# Patient Record
Sex: Female | Born: 1960 | Hispanic: Yes | Marital: Single | Smoking: Never smoker
Health system: Southern US, Community
[De-identification: ages and names within clinical notes are randomized; demographics above are authoritative.]

## PROBLEM LIST (undated history)

## (undated) DIAGNOSIS — E119 Type 2 diabetes mellitus without complications: Secondary | ICD-10-CM

## (undated) DIAGNOSIS — E7800 Pure hypercholesterolemia, unspecified: Secondary | ICD-10-CM

## (undated) DIAGNOSIS — I10 Essential (primary) hypertension: Secondary | ICD-10-CM

---

## 2018-07-09 ENCOUNTER — Emergency Department (HOSPITAL_COMMUNITY): Payer: Medicaid - Out of State

## 2018-07-09 ENCOUNTER — Other Ambulatory Visit: Payer: Self-pay

## 2018-07-09 ENCOUNTER — Encounter (HOSPITAL_COMMUNITY): Payer: Self-pay | Admitting: Emergency Medicine

## 2018-07-09 ENCOUNTER — Emergency Department (HOSPITAL_COMMUNITY)
Admission: EM | Admit: 2018-07-09 | Discharge: 2018-07-09 | Disposition: A | Discharge status: Home / Self Care | Payer: Medicaid - Out of State | Attending: Emergency Medicine | Admitting: Emergency Medicine

## 2018-07-09 DIAGNOSIS — I10 Essential (primary) hypertension: Secondary | ICD-10-CM | POA: Insufficient documentation

## 2018-07-09 DIAGNOSIS — Y999 Unspecified external cause status: Secondary | ICD-10-CM | POA: Diagnosis not present

## 2018-07-09 DIAGNOSIS — R1011 Right upper quadrant pain: Secondary | ICD-10-CM | POA: Diagnosis present

## 2018-07-09 DIAGNOSIS — Y93E2 Activity, laundry: Secondary | ICD-10-CM | POA: Insufficient documentation

## 2018-07-09 DIAGNOSIS — Y92013 Bedroom of single-family (private) house as the place of occurrence of the external cause: Secondary | ICD-10-CM | POA: Diagnosis not present

## 2018-07-09 DIAGNOSIS — S0003XA Contusion of scalp, initial encounter: Secondary | ICD-10-CM | POA: Diagnosis not present

## 2018-07-09 DIAGNOSIS — W01198A Fall on same level from slipping, tripping and stumbling with subsequent striking against other object, initial encounter: Secondary | ICD-10-CM | POA: Diagnosis not present

## 2018-07-09 DIAGNOSIS — S301XXA Contusion of abdominal wall, initial encounter: Secondary | ICD-10-CM | POA: Insufficient documentation

## 2018-07-09 DIAGNOSIS — E119 Type 2 diabetes mellitus without complications: Secondary | ICD-10-CM | POA: Diagnosis not present

## 2018-07-09 HISTORY — DX: Pure hypercholesterolemia, unspecified: E78.00

## 2018-07-09 HISTORY — DX: Type 2 diabetes mellitus without complications: E11.9

## 2018-07-09 HISTORY — DX: Essential (primary) hypertension: I10

## 2018-07-09 MED ORDER — MELOXICAM 7.5 MG PO TABS: 7.5000 mg | ORAL_TABLET | Freq: Two times a day (BID) | ORAL | 0 refills | Status: AC | PRN

## 2018-07-09 MED ORDER — MELOXICAM 7.5 MG PO TABS: 7.5000 mg | ORAL_TABLET | Freq: Two times a day (BID) | ORAL | 0 refills | Status: DC | PRN

## 2018-07-09 NOTE — ED Provider Notes (Signed)
[facility] EMERGENCY DEPARTMENT Provider Note   CSN: [id number] Arrival date & time: 07/09/18  2123   History   Chief Complaint Chief Complaint  Patient presents with  . Fall    HPI Audrey Morales is a 57 y.o. female.  HPI  The patient is a very pleasant 57 year old female, she has a known diabetic with high blood pressure.  She presents to the hospital today after having a fall while she was walking in her bedroom, she lost her balance while she was trying to carry laundry falling around the edge of the bed and striking her right flank and side.  This occurred yesterday, the pain has been persistent, mild, worse with rotation, associated with a small injury to the head when she bumped her head and has a small hematoma to the crown of the head.  There was no loss of consciousness, no nausea or vomiting, no hematuria, no abdominal pain.  She is concerned that she may have injured her kidney or her ribs.  Specifically the patient does not have any significant pain with deep breathing.  She has had no medications other than some over-the-counter medications prior to arrival.  Past Medical History:  Diagnosis Date  . Diabetes mellitus without complication (HCC)   . Hypercholesteremia   . Hypertension     There are no active problems to display for this patient.   History reviewed. No pertinent surgical history.   OB History   None      Home Medications    Prior to Admission medications   Medication Sig Start Date End Date Taking? Authorizing Provider  meloxicam (MOBIC) 7.5 MG tablet Take 1 tablet (7.5 mg total) by mouth 2 (two) times daily as needed for up to 7 days for pain. 07/09/18 07/16/18  Eber Hong, MD    Family History No family history on file.  Social History Social History   Tobacco Use  . Smoking status: Never Smoker  . Smokeless tobacco: Never Used  Substance Use Topics  . Alcohol use: Never    Frequency: Never  . Drug use: Never     Allergies     Patient has no allergy information on record.   Review of Systems Review of Systems  All other systems reviewed and are negative.    Physical Exam Updated Vital Signs BP (!) 159/87 (BP Location: Left Arm)   Pulse 84   Temp 97.9 F (36.6 C) (Oral)   Resp 16   Ht 1.702 m (5\' 7" )   Wt 105.7 kg   LMP 07/09/2018 (LMP Unknown)   SpO2 100%   BMI 36.49 kg/m   Physical Exam  Constitutional: She appears well-developed and well-nourished. No distress.  HENT:  Head: Normocephalic.  Mouth/Throat: Oropharynx is clear and moist. No oropharyngeal exudate.  Small hematoma to the posterior occiput at the crown, < 2 cm and very small, no open breaks to the skin, no depression of the skull - no hemotympanum and no racoon eyes or battles sign.  Eyes: Pupils are equal, round, and reactive to light. Conjunctivae and EOM are normal. Right eye exhibits no discharge. Left eye exhibits no discharge. No scleral icterus.  Neck: Normal range of motion. Neck supple. No JVD present. No thyromegaly present.  Cardiovascular: Normal rate, regular rhythm, normal heart sounds and intact distal pulses. Exam reveals no gallop and no friction rub.  No murmur heard. Pulmonary/Chest: Effort normal and breath sounds normal. No respiratory distress. She has no wheezes. She has no rales.  She exhibits tenderness ( over the R lower lateral ribs).  Abdominal: Soft. Bowel sounds are normal. She exhibits no distension and no mass. There is tenderness ( ttp over the R flank and side - bruising present).  Musculoskeletal: Normal range of motion. She exhibits no edema or tenderness.  All 4 extremities are very supple without any tenderness of the compartments, the joints are very supple, she has tenderness over her right flank but not over the spinal processes of the cervical thoracic or lumbar spines  Lymphadenopathy:    She has no cervical adenopathy.  Neurological: She is alert. Coordination normal.  Patient's gait is  normal, she is able to straight leg raise bilaterally.  Skin: Skin is warm and dry. No rash noted. No erythema.  Bruising as above  Psychiatric: She has a normal mood and affect. Her behavior is normal.  Nursing note and vitals reviewed.    ED Treatments / Results  Labs (all labs ordered are listed, but only abnormal results are displayed) Labs Reviewed - No data to display  EKG None  Radiology Ct Abdomen Pelvis Wo Contrast  Result Date: 07/09/2018 CLINICAL DATA:  Fall today with bilateral flank pain and bruising. EXAM: CT ABDOMEN AND PELVIS WITHOUT CONTRAST TECHNIQUE: Multidetector CT imaging of the abdomen and pelvis was performed following the standard protocol without IV contrast. COMPARISON:  None. FINDINGS: Lower chest: Lung bases are normal. Hepatobiliary: Previous cholecystectomy. Liver and biliary tree are normal. Pancreas: Normal. Spleen: Normal. Adrenals/Urinary Tract: Adrenal glands are normal. Kidneys normal size without hydronephrosis or nephrolithiasis. Ureters and bladder are normal. Stomach/Bowel: Stomach and small bowel are normal. Appendix is normal. Colon is normal. Vascular/Lymphatic: Minimal calcified plaque over the abdominal aorta. No adenopathy. Reproductive: Normal. Other: No free fluid or free peritoneal air. Musculoskeletal: Mild degenerate change of the spine. No acute fractures. IMPRESSION: No acute findings in the abdomen/pelvis.  No acute fractures. Aortic Atherosclerosis (ICD10-I70.0). Electronically Signed   By: Elberta Fortis M.D.   On: 07/09/2018 22:26    Procedures Procedures (including critical care time)  Medications Ordered in ED Medications - No data to display   Initial Impression / Assessment and Plan / ED Course  I have reviewed the triage vital signs and the nursing notes.  Pertinent labs & imaging results that were available during my care of the patient were reviewed by me and considered in my medical decision making (see chart for  details).  Clinical Course as of Jul 09 2230  Thu Jul 09, 2018  2209 CT shows no evidence of traumatic injury to the ribs or internal structions / liver / kidney.  Pt given reassurance.  Stable for d/c - no imaging needed of head or neck.   [BM]    Clinical Course User Index [BM] Eber Hong, MD   Will perform a CT scan to further evaluate the cause of the patient's pain, I suspect this is soft tissue however given the bruising the tenderness over the ribs and the proximity to both the liver and the kidney advanced imaging has been ordered.  Patient is agreeable.  CT neg Pt informed Stable for d/c  Final Clinical Impressions(s) / ED Diagnoses   Final diagnoses:  Contusion, flank, initial encounter    ED Discharge Orders         Ordered    meloxicam (MOBIC) 7.5 MG tablet  2 times daily PRN     07/09/18 2231           Eber Hong, MD  07/09/18 2231  

## 2018-07-09 NOTE — Discharge Instructions (Signed)
Your CT scan shows no broken ribs and no internal injuries!  Tylenol or ibuprofen for pain Should improve over the week.  ER if symptoms worsen.

## 2018-07-09 NOTE — ED Triage Notes (Signed)
Pt states she tripped and fell in her bedroom and hit her head and back on the dresser. Pt has minimal swelling to the back of the head with no bleeding.

## 2019-09-07 IMAGING — CT CT ABD-PELV W/O CM
2 of 4 series · 17 of 46 positions shown, 19 images · non-contrast
Comparison: None.

CLINICAL DATA: Fall today with bilateral flank pain and bruising.

EXAM:
CT ABDOMEN AND PELVIS WITHOUT CONTRAST
TECHNIQUE: Multidetector CT imaging of the abdomen and pelvis was performed
following the standard protocol without IV contrast.

[Series 2: axial st · axial · 0.98mm/px · z∈[-395,+65]mm · 14 of 102 slices shown, 16 images]
[im 5/102  soft-tissue]
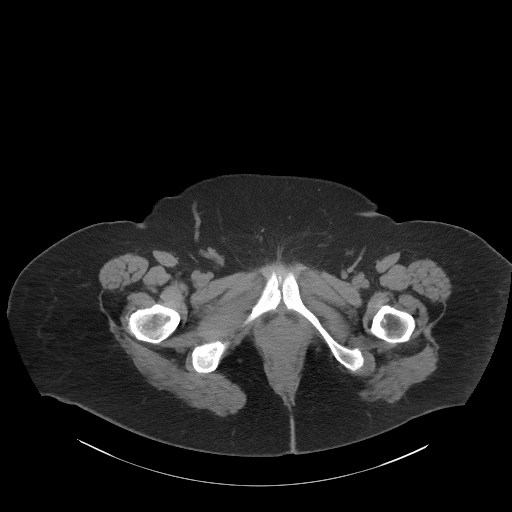
[im 5/102  bone]
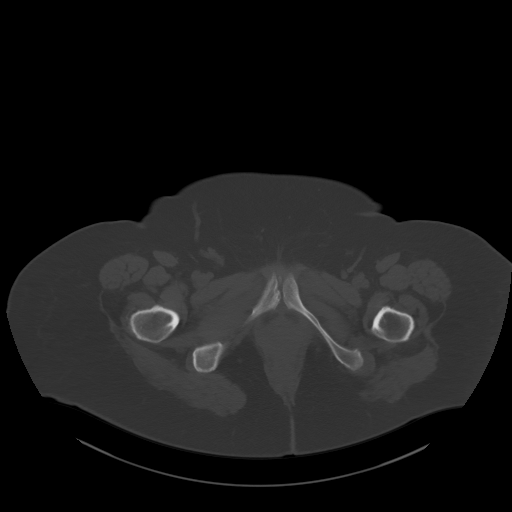
[im 14/102  soft-tissue]
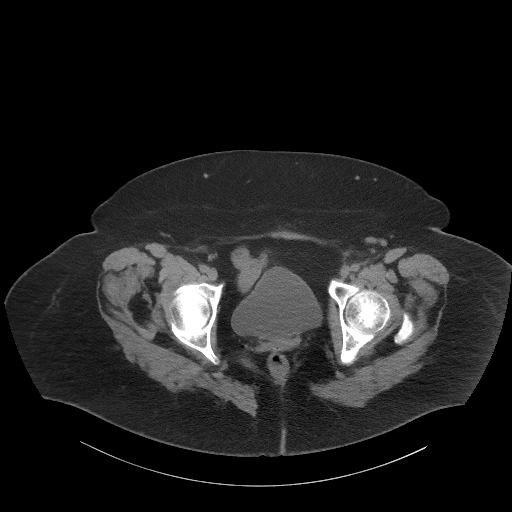
[im 18/102  soft-tissue]
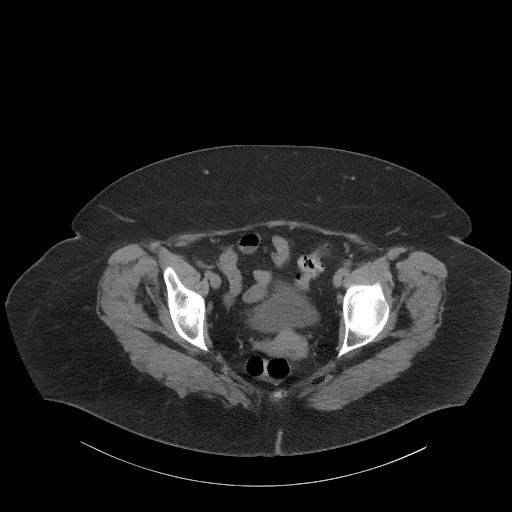
[im 27/102  soft-tissue]
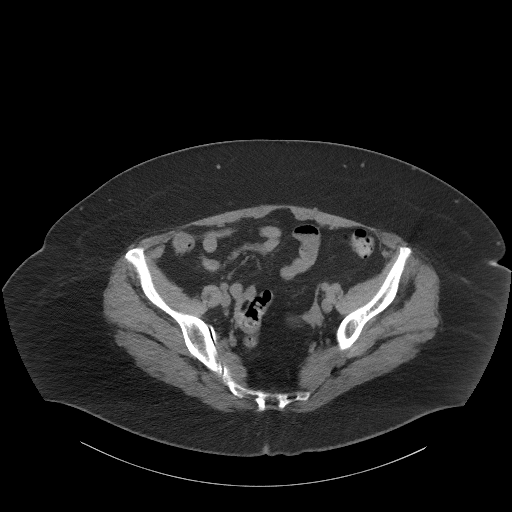
[im 36/102  soft-tissue]
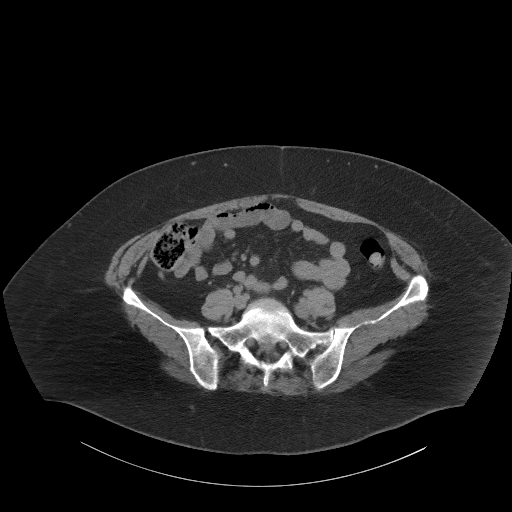
[im 40/102  soft-tissue]
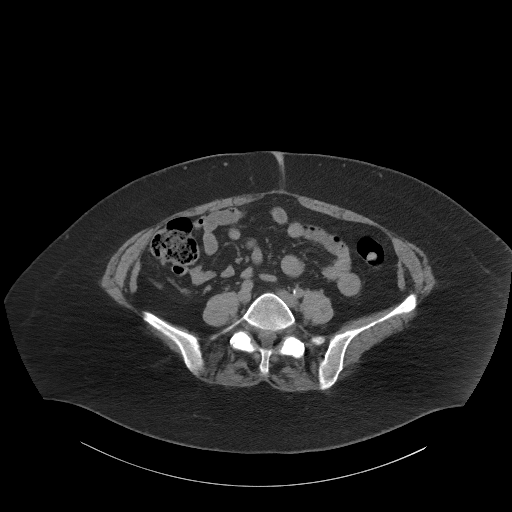
[im 49/102  soft-tissue]
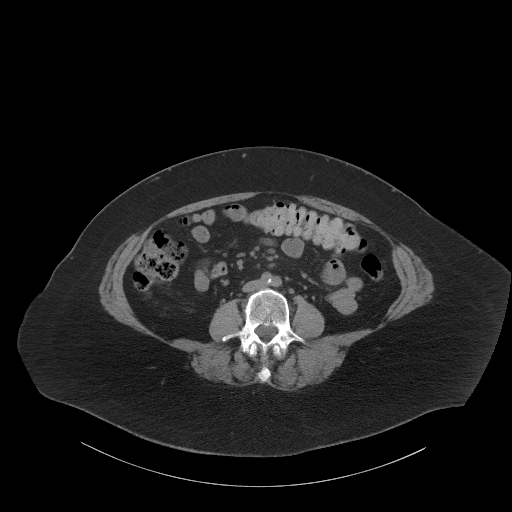
[im 53/102  soft-tissue]
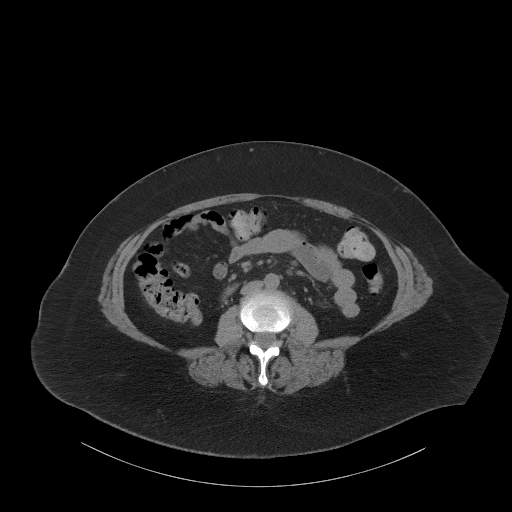
[im 62/102  soft-tissue]
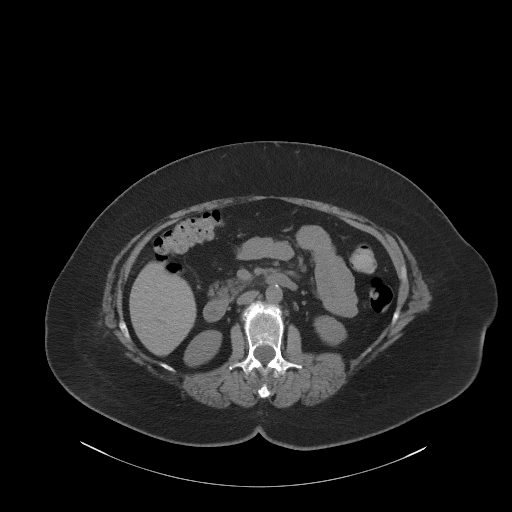
[im 62/102  bone]
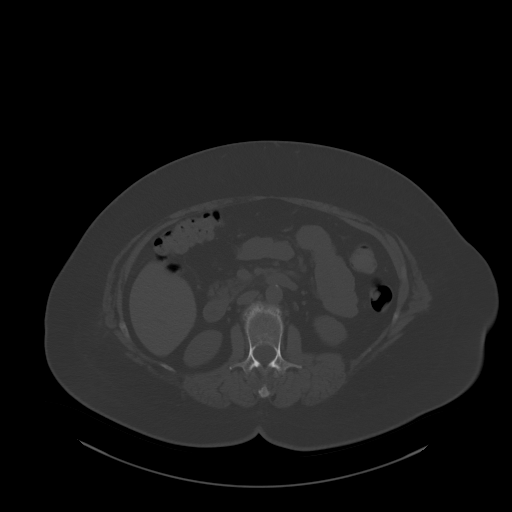
[im 66/102  soft-tissue]
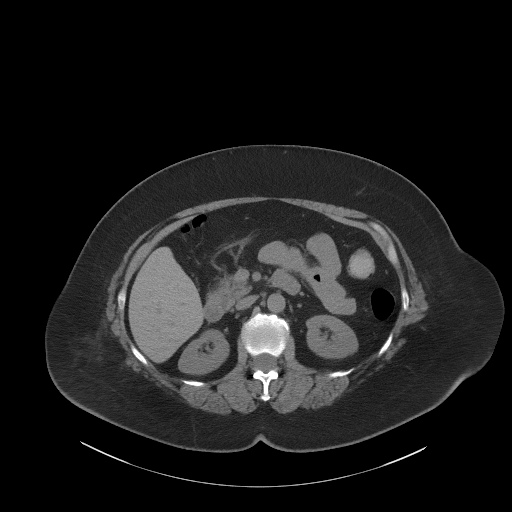
[im 75/102  soft-tissue]
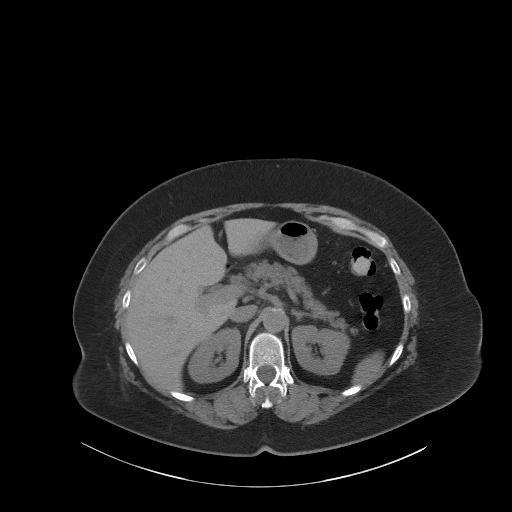
[im 84/102  soft-tissue]
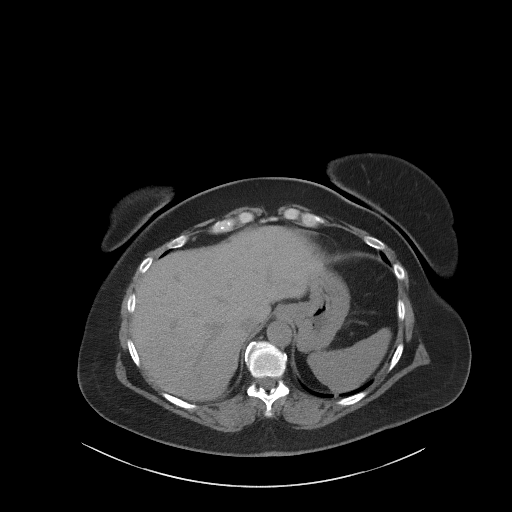
[im 88/102  soft-tissue]
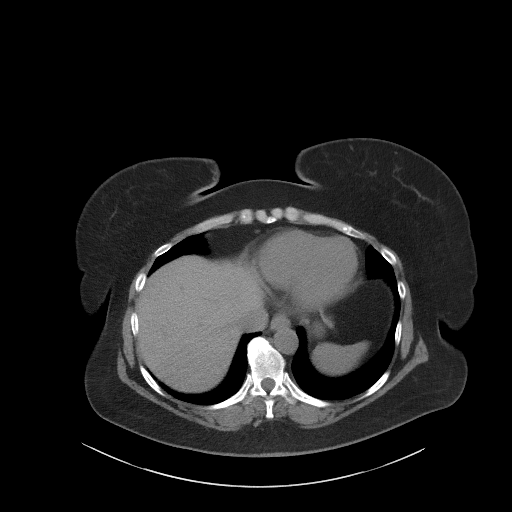
[im 97/102  soft-tissue]
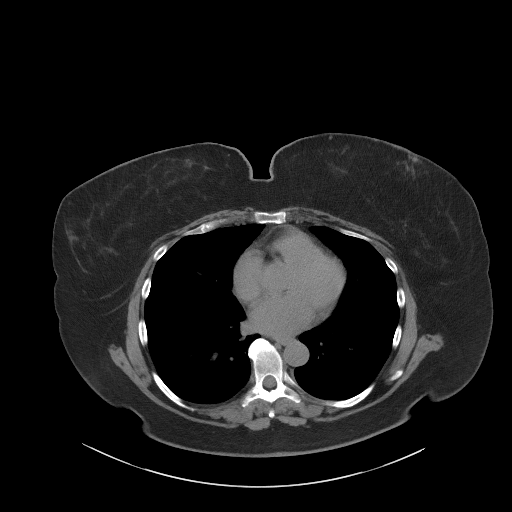

[Series 5: coronal st · coronal · 0.89mm/px · 3 of 93 slices shown]
[im 31/93  soft-tissue]
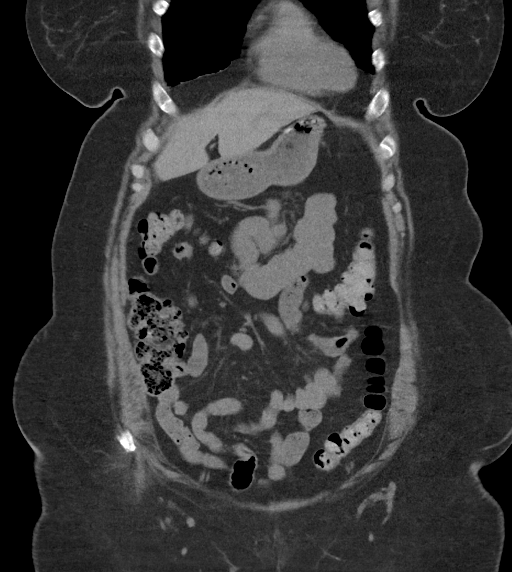
[im 41/93  soft-tissue]
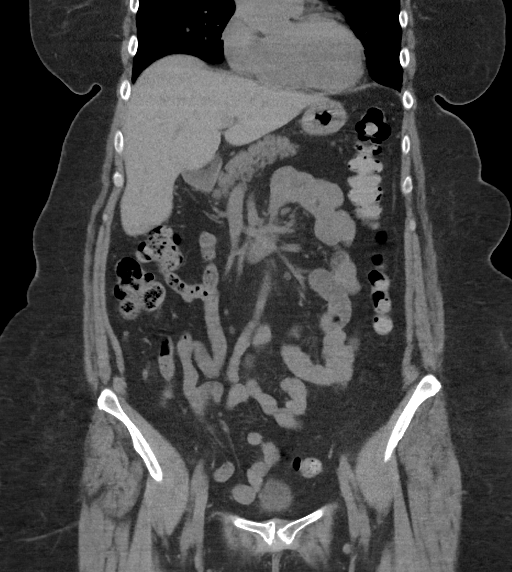
[im 52/93  soft-tissue]
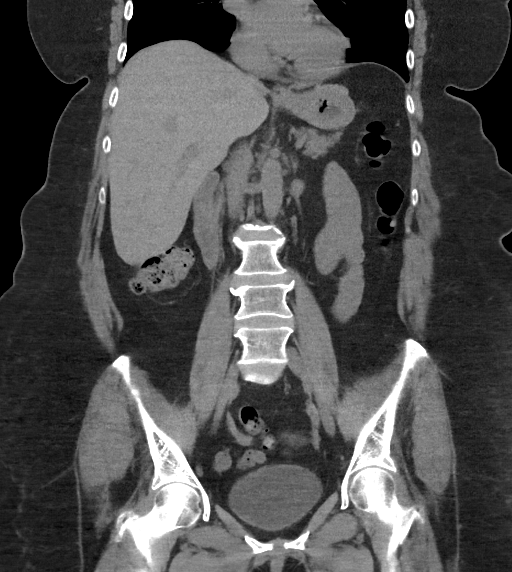

[17 of 46 positions shown; findings below may reference images not displayed]

FINDINGS: Lower chest: Lung bases are normal.

Hepatobiliary: Previous cholecystectomy. Liver and biliary tree are
normal.

Pancreas: Normal.

Spleen: Normal.

Adrenals/Urinary Tract: Adrenal glands are normal. Kidneys normal
size without hydronephrosis or nephrolithiasis. Ureters and bladder
are normal.

Stomach/Bowel: Stomach and small bowel are normal. Appendix is
normal. Colon is normal.

Vascular/Lymphatic: Minimal calcified plaque over the abdominal
aorta. No adenopathy.

Reproductive: Normal.

Other: No free fluid or free peritoneal air.

Musculoskeletal: Mild degenerate change of the spine. No acute
fractures.
IMPRESSION: No acute findings in the abdomen/pelvis.  No acute fractures.

Aortic Atherosclerosis ([id number]).
